# Patient Record
Sex: Male | Born: 1965 | Race: White | Hispanic: No | Marital: Married | Smoking: Never smoker
Health system: Southern US, Community
[De-identification: ages and names within clinical notes are randomized; demographics above are authoritative.]

---

## 2005-09-29 ENCOUNTER — Ambulatory Visit: Payer: Self-pay | Admitting: Internal Medicine

## 2007-06-17 ENCOUNTER — Ambulatory Visit: Payer: Self-pay | Admitting: Internal Medicine

## 2007-06-27 ENCOUNTER — Ambulatory Visit: Payer: Self-pay | Admitting: Internal Medicine

## 2007-08-01 ENCOUNTER — Ambulatory Visit: Payer: Self-pay | Admitting: Internal Medicine

## 2007-09-30 ENCOUNTER — Ambulatory Visit: Payer: Self-pay | Admitting: Internal Medicine

## 2010-04-05 ENCOUNTER — Emergency Department: Payer: Self-pay | Admitting: Emergency Medicine

## 2011-01-02 ENCOUNTER — Emergency Department: Payer: Self-pay | Admitting: Emergency Medicine

## 2011-01-08 ENCOUNTER — Ambulatory Visit: Payer: Self-pay | Admitting: Podiatry

## 2011-09-23 ENCOUNTER — Ambulatory Visit: Payer: Self-pay

## 2012-11-15 ENCOUNTER — Ambulatory Visit: Payer: Self-pay | Admitting: Ophthalmology

## 2013-05-10 ENCOUNTER — Ambulatory Visit: Payer: Self-pay | Admitting: Internal Medicine

## 2013-10-27 IMAGING — US ABDOMEN ULTRASOUND LIMITED
1 series · 17 of 25 positions shown · non-contrast
Comparison: none

REASON FOR EXAM: abd pain generalized
COMMENTS:

PROCEDURE:     [facility] - JEAN CHARLES ABDOMEN UPPER GENERAL  - September 23, 2011  [DATE]
RESULT:     Comparison: None
TECHNIQUE: Multiple gray-scale and color-flow Doppler images of the abdomen
are presented for review.

[Series 1: abdomen ultrasound limited · 17 of 97 slices shown]
[im 1/97]
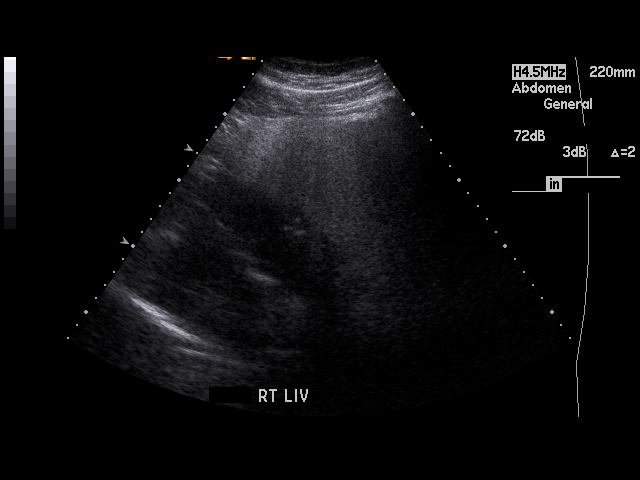
[im 9/97]
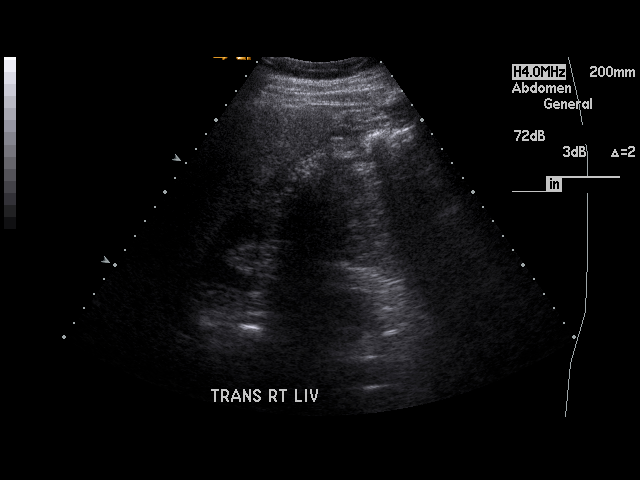
[im 13/97]
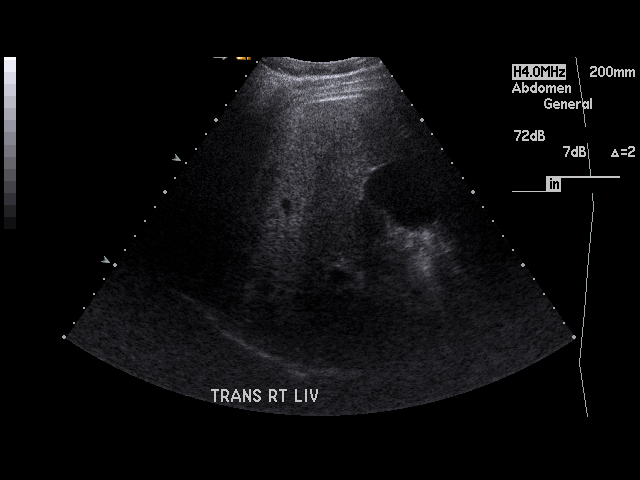
[im 21/97]
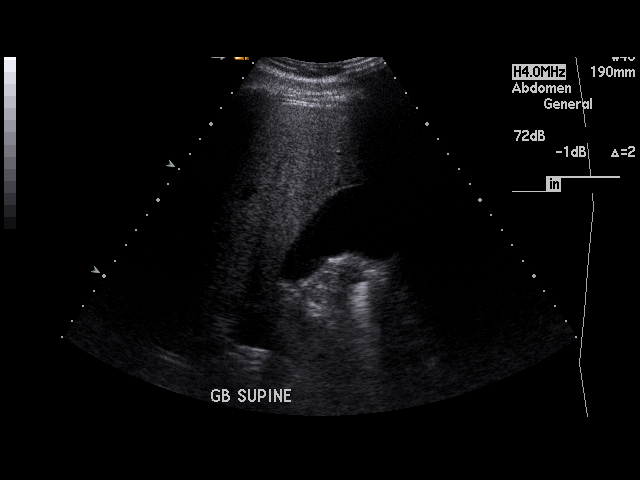
[im 25/97]
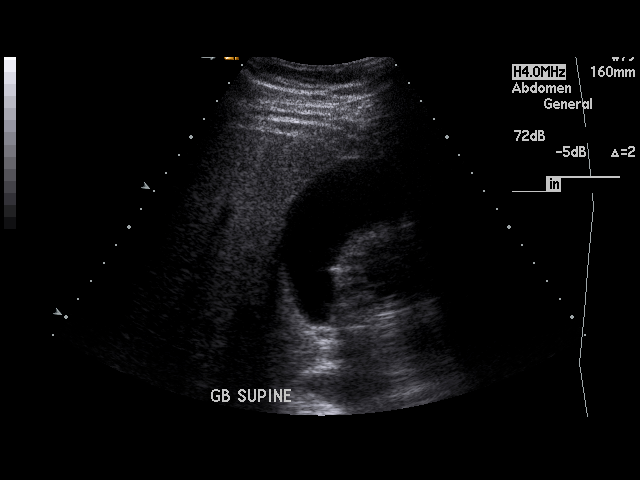
[im 33/97]
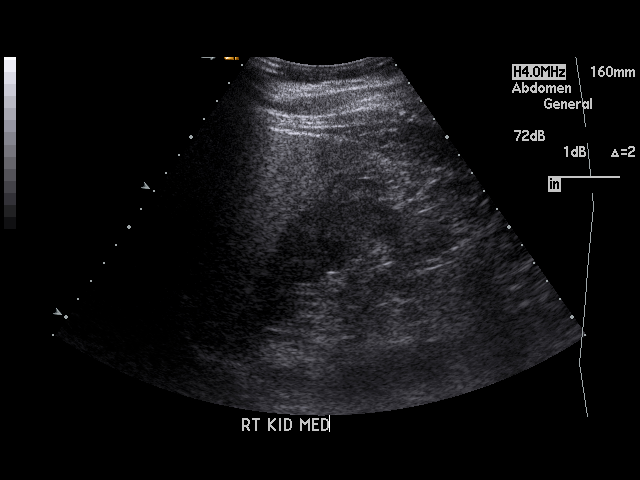
[im 37/97]
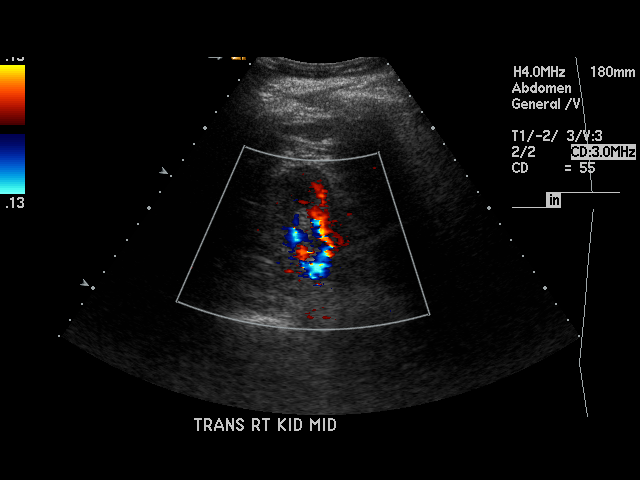
[im 45/97]
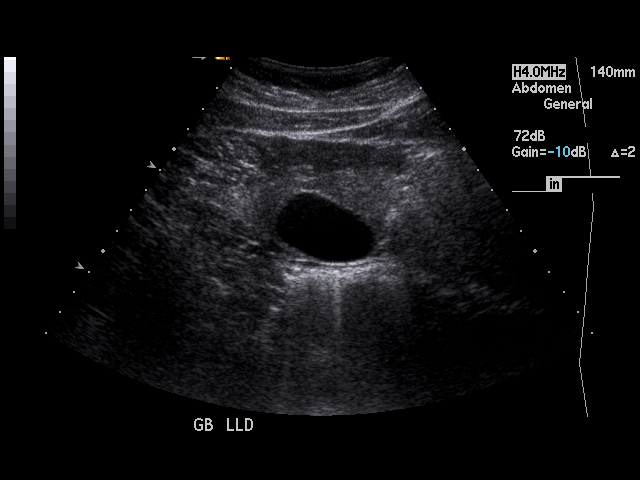
[im 49/97]
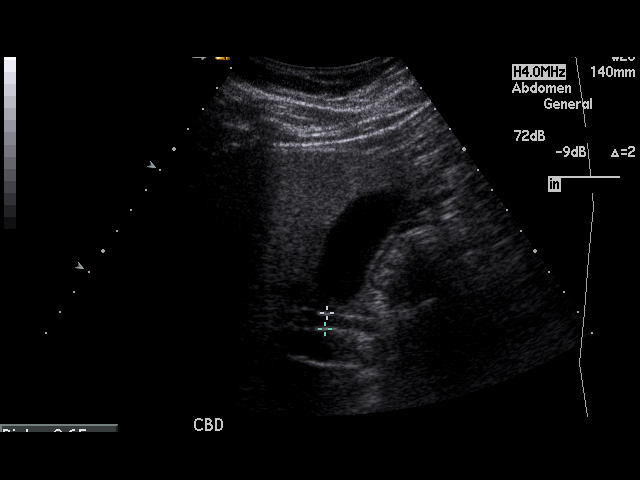
[im 53/97]
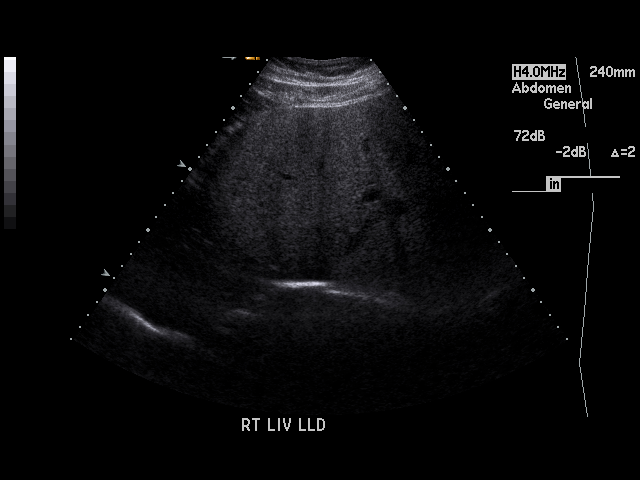
[im 61/97]
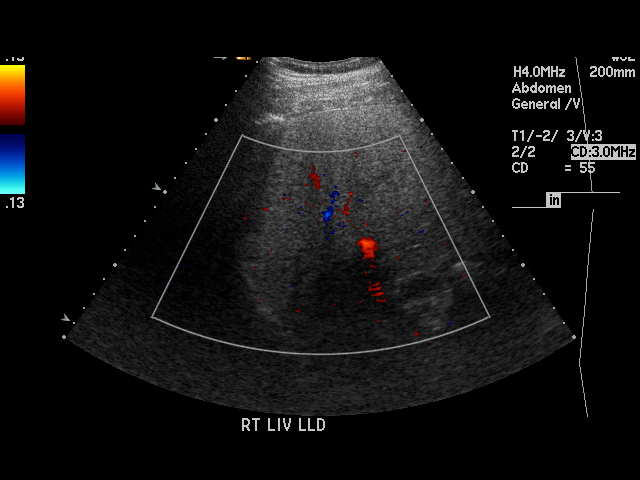
[im 65/97]
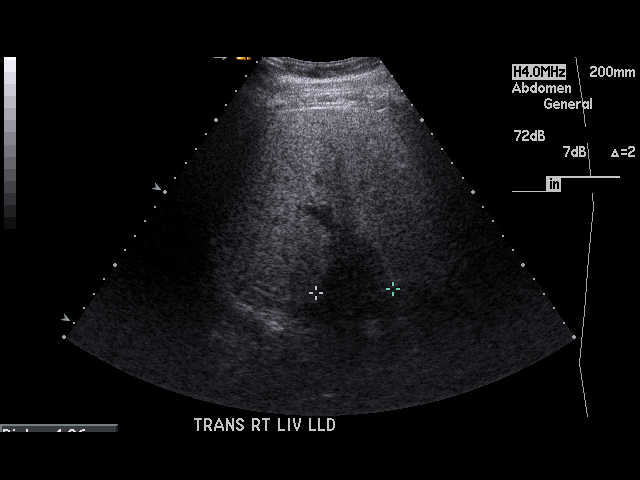
[im 73/97]
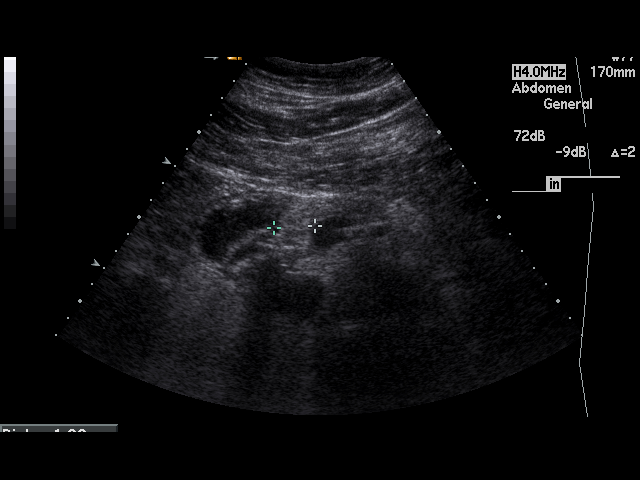
[im 77/97]
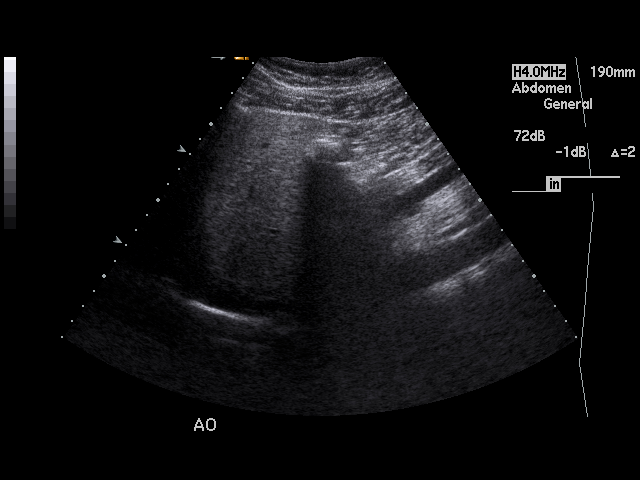
[im 85/97]
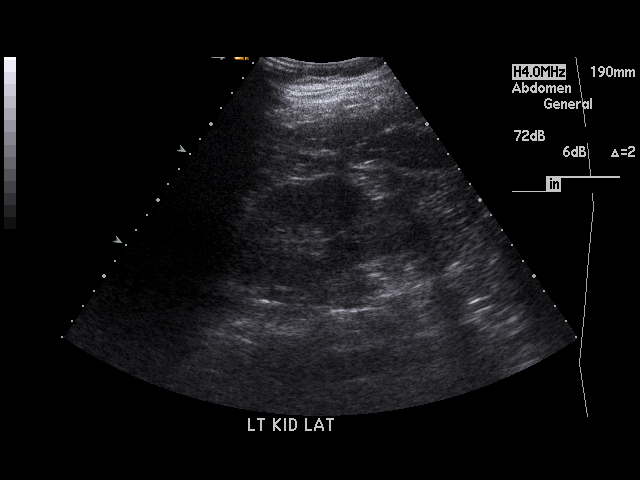
[im 89/97]
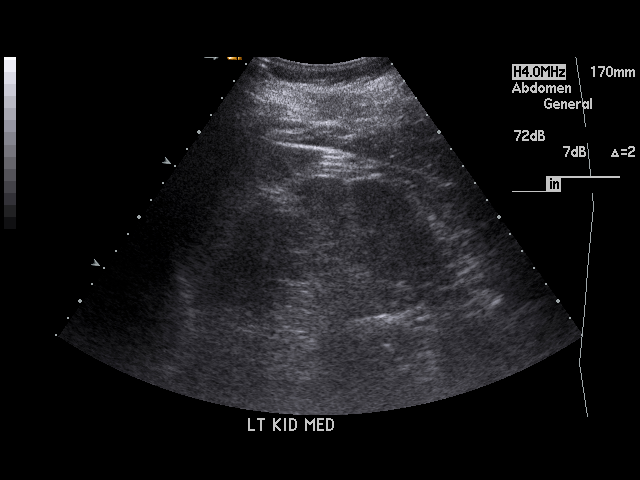
[im 97/97]
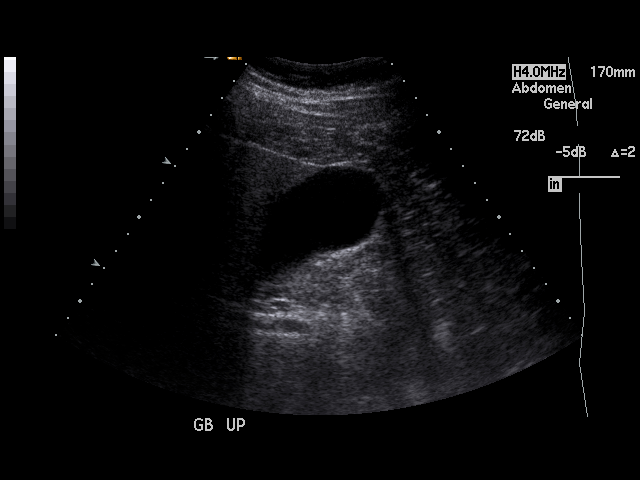

[17 of 25 positions shown; findings below may reference images not displayed]

FINDINGS: The liver is diffusely increased in echogenicity as can be seen with hepatic
steatosis. There is a 6.5 x 4.3 cm area of hypoechogenicity in the posterior
right hepatic lobe near the dome of the liver which may be artifactual
versus an underlying mass.

There is no cholelithiasis or biliary sludge. There is no intra- or
extrahepatic biliary ductal dilatation. The common duct measures 6.5 mm in
maximal diameter. There is no gallbladder wall thickening, pericholecystic
fluid, or sonographic Murphy's sign.

The visualized portion of the pancreas is normal in echogenicity. The spleen
is unremarkable. Bilateral kidneys are normal in echogenicity and size. The
right kidney measures 11.4 x 5.8 x 5 cm. The left kidney measures 11.7 x
x 5.4 cm. There are no renal calculi or hydronephrosis. The abdominal aorta
and IVC are unremarkable.
IMPRESSION: No cholelithiasis or sonographic evidence of acute cholecystitis.

There is a 6.5 x 4.3 cm area of hypoechogenicity in the posterior right
hepatic lobe near the dome of the liver which may be artifactual versus an
underlying mass. Recommend a multiphasic MRI of the abdomen to help further
characterize this area.

## 2014-12-20 IMAGING — CT CT ORBITS WITHOUT CONTRAST
1 series · 15 of 30 positions shown, 19 images · non-contrast
Comparison: none

REASON FOR EXAM: bone mass in left lateral canthus
COMMENTS:

[Series 2: axial bone · axial · 0.34mm/px · z∈[-187,-85]mm · 15 of 55 slices shown, 19 images]
[im 2/55  brain]
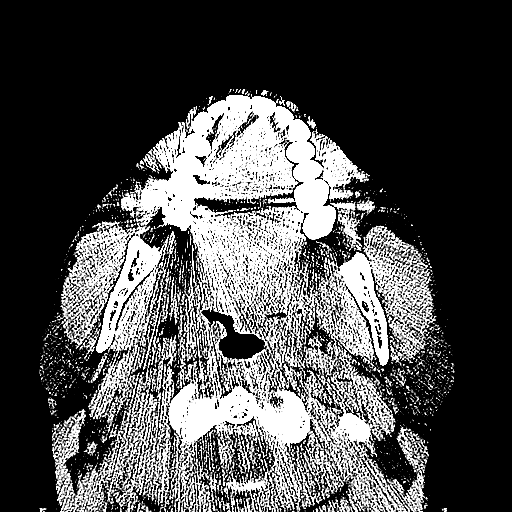
[im 2/55  bone]
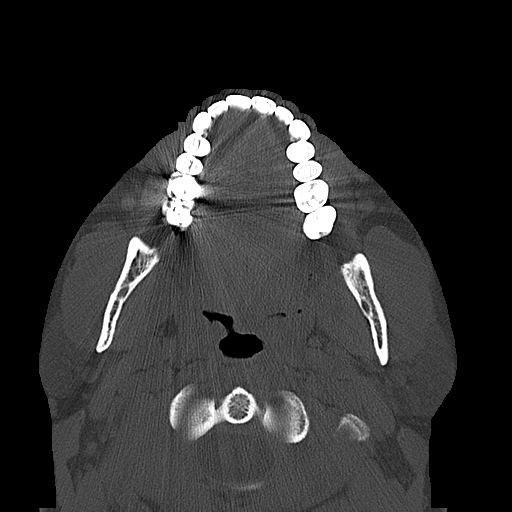
[im 6/55  bone]
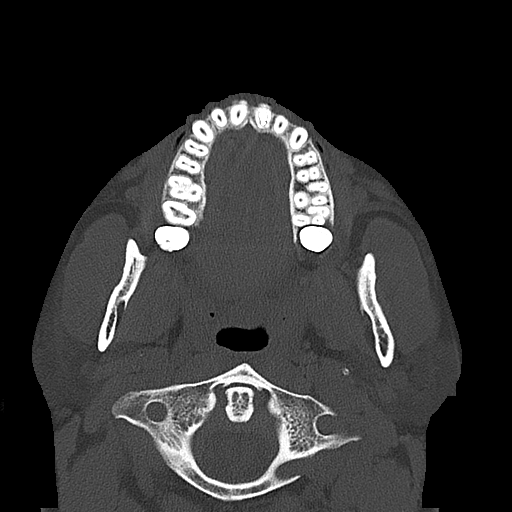
[im 10/55  bone]
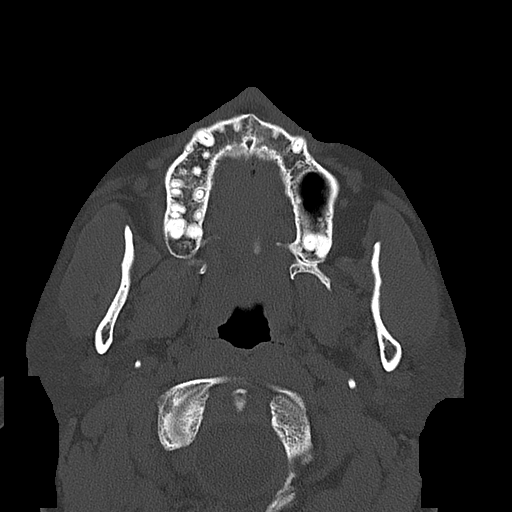
[im 14/55  bone]
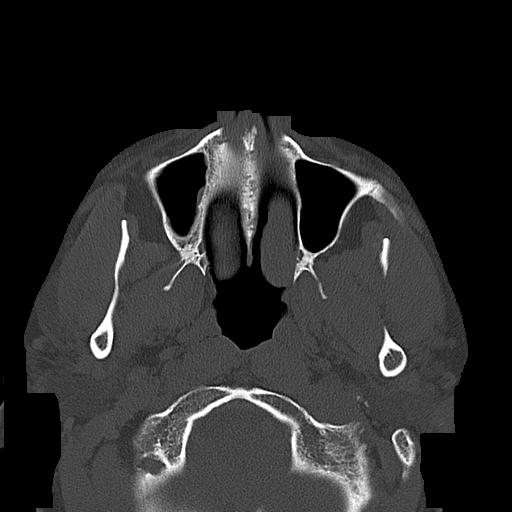
[im 17/55  brain]
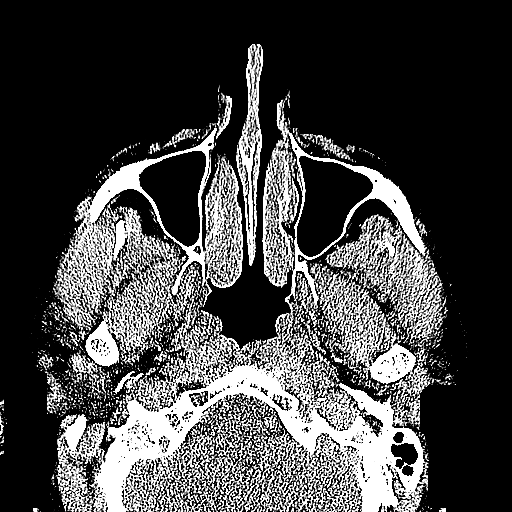
[im 17/55  bone]
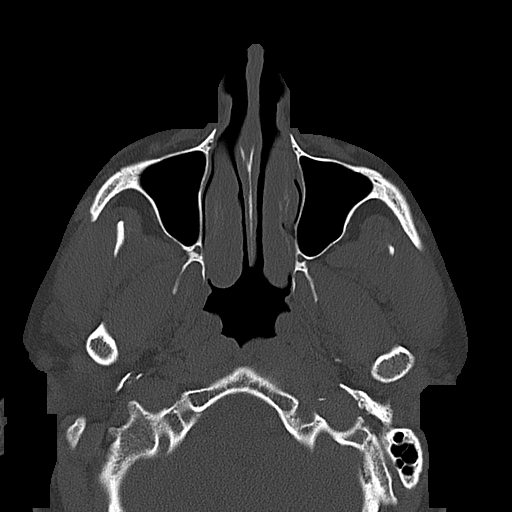
[im 21/55  bone]
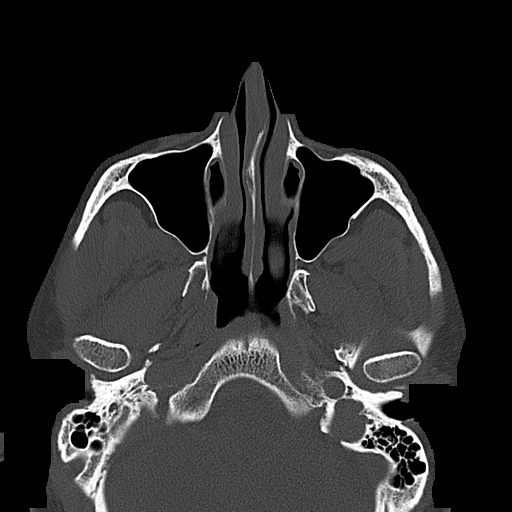
[im 25/55  bone]
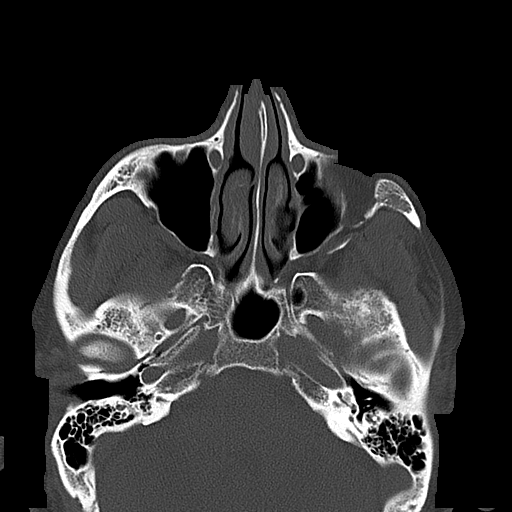
[im 28/55  bone]
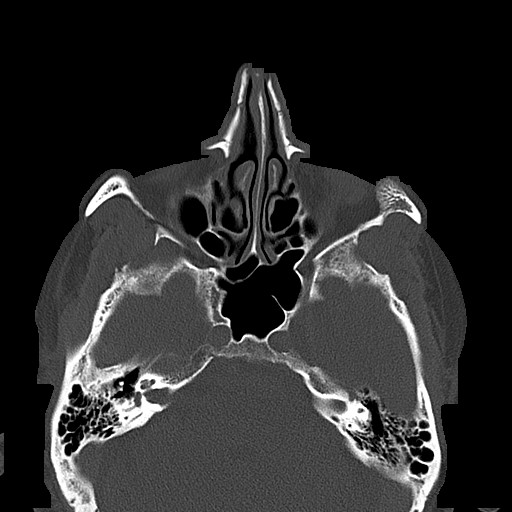
[im 30/55  brain]
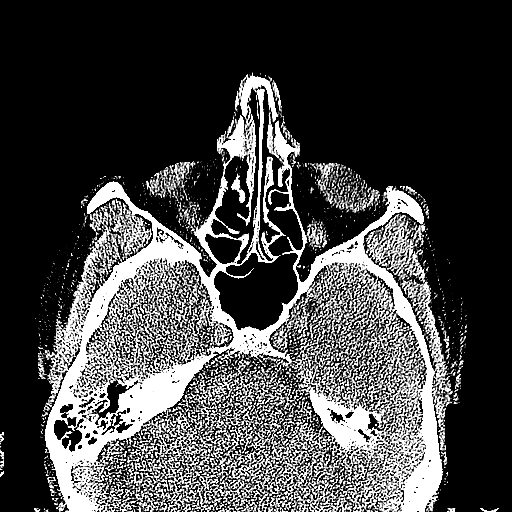
[im 30/55  bone]
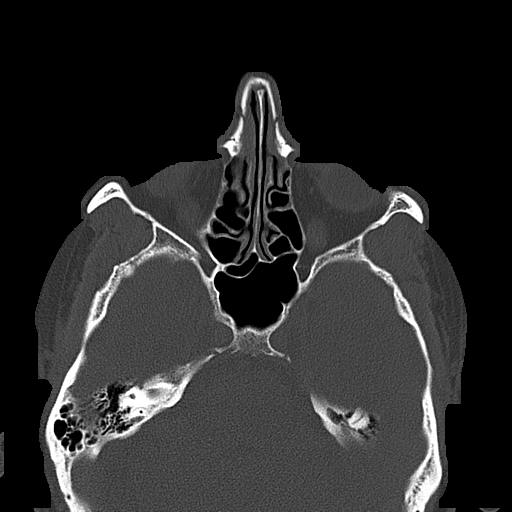
[im 34/55  bone]
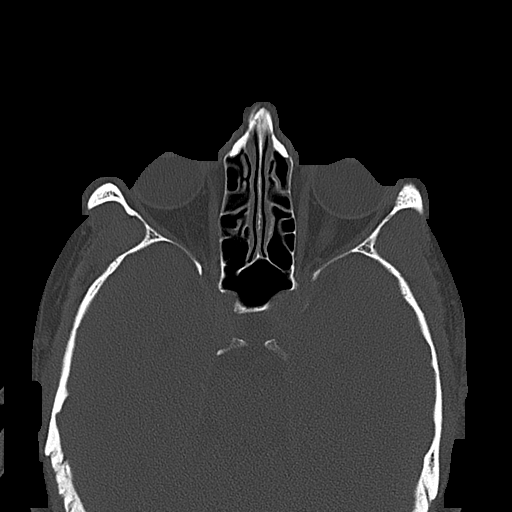
[im 38/55  bone]
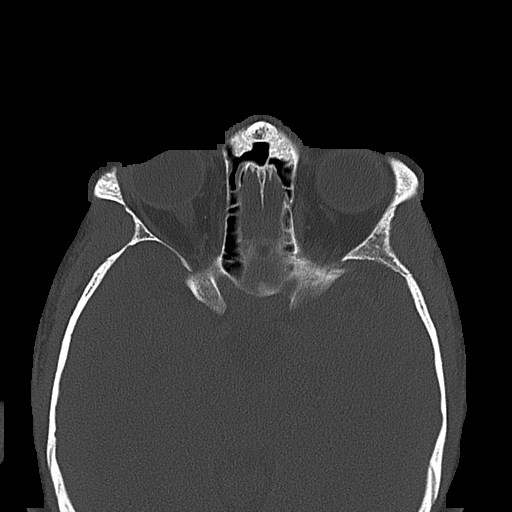
[im 41/55  bone]
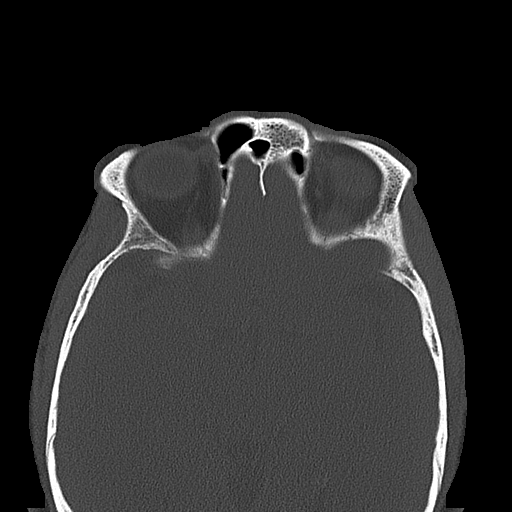
[im 45/55  brain]
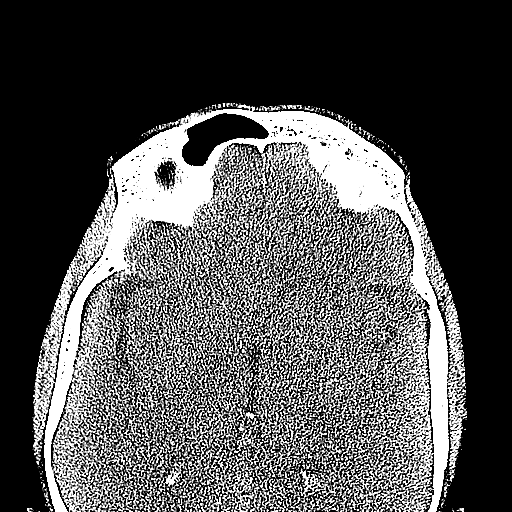
[im 45/55  bone]
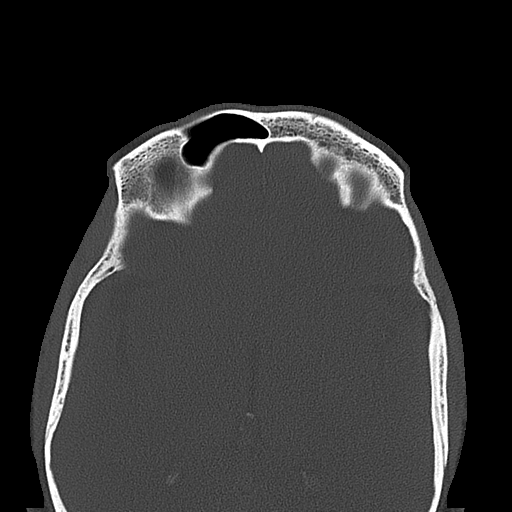
[im 49/55  bone]
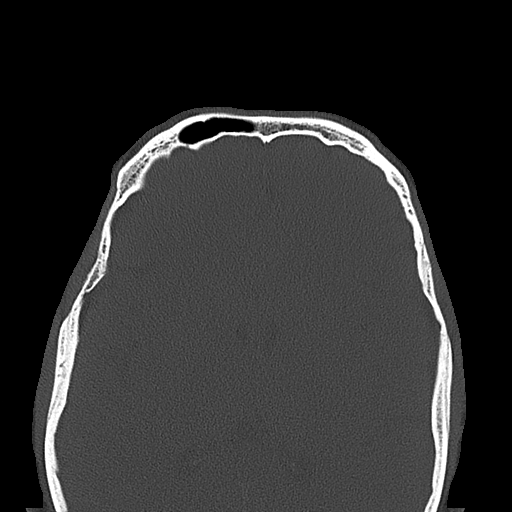
[im 53/55  bone]
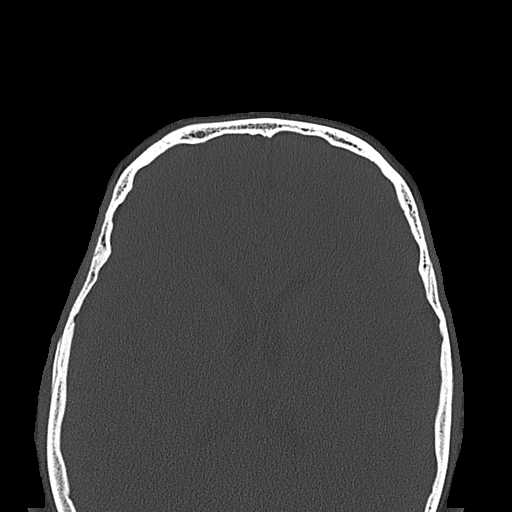

[15 of 30 positions shown; findings below may reference images not displayed]

PROCEDURE:     [facility] - KCT ORBITS OR TEMPORAL BONE WO  - November 15, 2012  [DATE]

RESULT:     Axial CT scanning was performed through the facial bones with
attention to the orbits. Coronal reconstructions were obtained. Images were
acquired at 3 mm intervals and slice thicknesses in the axial plane.

There is increased prominence of the lateral aspect of the bony orbit on the
left as compared to the right. This corresponds to the clinical findings.
This is at the junction of the anterior/inferior aspect of the zygomatic
bone with the anterior-inferior aspect of the lateral orbital wall. The
globes are grossly intact. The pre- and postseptal soft tissues appear
normal. The extraocular eye muscles appear normal. The paranasal sinuses are
clear. The left frontal sinuses are hypoplastic. The nasal septum is midline.
IMPRESSION: 1. There is expansile remodeling of the extreme lateral aspect of the bony
orbit at the junction of the inferior aspect of the zygomatic arch with the
lateral bony orbit. There is no periosteal reaction here. There appears to
be some expansile remodeling of the bone. Further evaluation with MRI with
in an effort to evaluate the bone marrow is needed.
2. No abnormality of the bony orbits is seen elsewhere. The soft tissues
also appear normal.

[REDACTED]

## 2016-04-27 ENCOUNTER — Other Ambulatory Visit: Payer: Self-pay | Admitting: Internal Medicine

## 2016-04-27 DIAGNOSIS — M545 Low back pain: Secondary | ICD-10-CM

## 2016-05-12 ENCOUNTER — Ambulatory Visit
Admission: RE | Admit: 2016-05-12 | Discharge: 2016-05-12 | Disposition: A | Discharge status: Home / Self Care | Payer: 59 | Source: Ambulatory Visit | Attending: Internal Medicine | Admitting: Internal Medicine

## 2016-05-12 DIAGNOSIS — M5127 Other intervertebral disc displacement, lumbosacral region: Secondary | ICD-10-CM | POA: Insufficient documentation

## 2016-05-12 DIAGNOSIS — M5126 Other intervertebral disc displacement, lumbar region: Secondary | ICD-10-CM | POA: Diagnosis not present

## 2016-05-12 DIAGNOSIS — M545 Low back pain: Secondary | ICD-10-CM

## 2016-07-13 DIAGNOSIS — M542 Cervicalgia: Secondary | ICD-10-CM | POA: Diagnosis not present

## 2016-07-13 DIAGNOSIS — R0789 Other chest pain: Secondary | ICD-10-CM | POA: Diagnosis not present

## 2016-07-13 DIAGNOSIS — E784 Other hyperlipidemia: Secondary | ICD-10-CM | POA: Diagnosis not present

## 2016-07-15 DIAGNOSIS — M9903 Segmental and somatic dysfunction of lumbar region: Secondary | ICD-10-CM | POA: Diagnosis not present

## 2016-07-15 DIAGNOSIS — M5116 Intervertebral disc disorders with radiculopathy, lumbar region: Secondary | ICD-10-CM | POA: Diagnosis not present

## 2016-07-15 DIAGNOSIS — M5117 Intervertebral disc disorders with radiculopathy, lumbosacral region: Secondary | ICD-10-CM | POA: Diagnosis not present

## 2016-07-16 DIAGNOSIS — Z85828 Personal history of other malignant neoplasm of skin: Secondary | ICD-10-CM | POA: Diagnosis not present

## 2016-07-17 DIAGNOSIS — M542 Cervicalgia: Secondary | ICD-10-CM | POA: Diagnosis not present

## 2016-07-17 DIAGNOSIS — G8929 Other chronic pain: Secondary | ICD-10-CM | POA: Diagnosis not present

## 2016-07-27 DIAGNOSIS — R0789 Other chest pain: Secondary | ICD-10-CM | POA: Diagnosis not present

## 2016-07-27 DIAGNOSIS — M5136 Other intervertebral disc degeneration, lumbar region: Secondary | ICD-10-CM | POA: Diagnosis not present

## 2016-08-06 DIAGNOSIS — M5116 Intervertebral disc disorders with radiculopathy, lumbar region: Secondary | ICD-10-CM | POA: Diagnosis not present

## 2016-08-06 DIAGNOSIS — M9903 Segmental and somatic dysfunction of lumbar region: Secondary | ICD-10-CM | POA: Diagnosis not present

## 2016-08-06 DIAGNOSIS — M5117 Intervertebral disc disorders with radiculopathy, lumbosacral region: Secondary | ICD-10-CM | POA: Diagnosis not present

## 2016-09-10 DIAGNOSIS — M5116 Intervertebral disc disorders with radiculopathy, lumbar region: Secondary | ICD-10-CM | POA: Diagnosis not present

## 2016-09-10 DIAGNOSIS — M5117 Intervertebral disc disorders with radiculopathy, lumbosacral region: Secondary | ICD-10-CM | POA: Diagnosis not present

## 2016-09-10 DIAGNOSIS — M9903 Segmental and somatic dysfunction of lumbar region: Secondary | ICD-10-CM | POA: Diagnosis not present

## 2016-10-15 DIAGNOSIS — M9903 Segmental and somatic dysfunction of lumbar region: Secondary | ICD-10-CM | POA: Diagnosis not present

## 2016-10-15 DIAGNOSIS — M5117 Intervertebral disc disorders with radiculopathy, lumbosacral region: Secondary | ICD-10-CM | POA: Diagnosis not present

## 2016-10-15 DIAGNOSIS — M5116 Intervertebral disc disorders with radiculopathy, lumbar region: Secondary | ICD-10-CM | POA: Diagnosis not present

## 2016-11-04 DIAGNOSIS — M9903 Segmental and somatic dysfunction of lumbar region: Secondary | ICD-10-CM | POA: Diagnosis not present

## 2016-11-04 DIAGNOSIS — M5117 Intervertebral disc disorders with radiculopathy, lumbosacral region: Secondary | ICD-10-CM | POA: Diagnosis not present

## 2016-11-04 DIAGNOSIS — M5116 Intervertebral disc disorders with radiculopathy, lumbar region: Secondary | ICD-10-CM | POA: Diagnosis not present

## 2016-11-10 DIAGNOSIS — E538 Deficiency of other specified B group vitamins: Secondary | ICD-10-CM | POA: Diagnosis not present

## 2016-11-10 DIAGNOSIS — R739 Hyperglycemia, unspecified: Secondary | ICD-10-CM | POA: Diagnosis not present

## 2016-11-10 DIAGNOSIS — E349 Endocrine disorder, unspecified: Secondary | ICD-10-CM | POA: Diagnosis not present

## 2016-11-17 DIAGNOSIS — Z0000 Encounter for general adult medical examination without abnormal findings: Secondary | ICD-10-CM | POA: Diagnosis not present

## 2016-11-17 DIAGNOSIS — E784 Other hyperlipidemia: Secondary | ICD-10-CM | POA: Diagnosis not present

## 2016-11-17 DIAGNOSIS — R739 Hyperglycemia, unspecified: Secondary | ICD-10-CM | POA: Diagnosis not present

## 2016-11-26 DIAGNOSIS — M9903 Segmental and somatic dysfunction of lumbar region: Secondary | ICD-10-CM | POA: Diagnosis not present

## 2016-11-26 DIAGNOSIS — M5116 Intervertebral disc disorders with radiculopathy, lumbar region: Secondary | ICD-10-CM | POA: Diagnosis not present

## 2016-11-26 DIAGNOSIS — M5117 Intervertebral disc disorders with radiculopathy, lumbosacral region: Secondary | ICD-10-CM | POA: Diagnosis not present

## 2017-01-15 DIAGNOSIS — M5116 Intervertebral disc disorders with radiculopathy, lumbar region: Secondary | ICD-10-CM | POA: Diagnosis not present

## 2017-01-15 DIAGNOSIS — M5117 Intervertebral disc disorders with radiculopathy, lumbosacral region: Secondary | ICD-10-CM | POA: Diagnosis not present

## 2017-01-15 DIAGNOSIS — M9903 Segmental and somatic dysfunction of lumbar region: Secondary | ICD-10-CM | POA: Diagnosis not present

## 2017-01-21 DIAGNOSIS — M9903 Segmental and somatic dysfunction of lumbar region: Secondary | ICD-10-CM | POA: Diagnosis not present

## 2017-01-21 DIAGNOSIS — M5117 Intervertebral disc disorders with radiculopathy, lumbosacral region: Secondary | ICD-10-CM | POA: Diagnosis not present

## 2017-01-21 DIAGNOSIS — M5116 Intervertebral disc disorders with radiculopathy, lumbar region: Secondary | ICD-10-CM | POA: Diagnosis not present

## 2017-02-04 DIAGNOSIS — M5116 Intervertebral disc disorders with radiculopathy, lumbar region: Secondary | ICD-10-CM | POA: Diagnosis not present

## 2017-02-04 DIAGNOSIS — M9903 Segmental and somatic dysfunction of lumbar region: Secondary | ICD-10-CM | POA: Diagnosis not present

## 2017-02-04 DIAGNOSIS — M5117 Intervertebral disc disorders with radiculopathy, lumbosacral region: Secondary | ICD-10-CM | POA: Diagnosis not present

## 2017-03-01 DIAGNOSIS — M5117 Intervertebral disc disorders with radiculopathy, lumbosacral region: Secondary | ICD-10-CM | POA: Diagnosis not present

## 2017-03-01 DIAGNOSIS — M5116 Intervertebral disc disorders with radiculopathy, lumbar region: Secondary | ICD-10-CM | POA: Diagnosis not present

## 2017-03-01 DIAGNOSIS — M9903 Segmental and somatic dysfunction of lumbar region: Secondary | ICD-10-CM | POA: Diagnosis not present

## 2017-03-17 DIAGNOSIS — M79602 Pain in left arm: Secondary | ICD-10-CM | POA: Diagnosis not present

## 2017-03-30 DIAGNOSIS — M9903 Segmental and somatic dysfunction of lumbar region: Secondary | ICD-10-CM | POA: Diagnosis not present

## 2017-03-30 DIAGNOSIS — M5116 Intervertebral disc disorders with radiculopathy, lumbar region: Secondary | ICD-10-CM | POA: Diagnosis not present

## 2017-03-30 DIAGNOSIS — M5117 Intervertebral disc disorders with radiculopathy, lumbosacral region: Secondary | ICD-10-CM | POA: Diagnosis not present

## 2017-04-01 DIAGNOSIS — Z1211 Encounter for screening for malignant neoplasm of colon: Secondary | ICD-10-CM | POA: Diagnosis not present

## 2017-04-01 DIAGNOSIS — Z8371 Family history of colonic polyps: Secondary | ICD-10-CM | POA: Diagnosis not present

## 2017-04-05 DIAGNOSIS — M5116 Intervertebral disc disorders with radiculopathy, lumbar region: Secondary | ICD-10-CM | POA: Diagnosis not present

## 2017-04-05 DIAGNOSIS — M5117 Intervertebral disc disorders with radiculopathy, lumbosacral region: Secondary | ICD-10-CM | POA: Diagnosis not present

## 2017-04-05 DIAGNOSIS — M9903 Segmental and somatic dysfunction of lumbar region: Secondary | ICD-10-CM | POA: Diagnosis not present

## 2017-04-19 DIAGNOSIS — D124 Benign neoplasm of descending colon: Secondary | ICD-10-CM | POA: Diagnosis not present

## 2017-04-19 DIAGNOSIS — K648 Other hemorrhoids: Secondary | ICD-10-CM | POA: Diagnosis not present

## 2017-04-19 DIAGNOSIS — Z1211 Encounter for screening for malignant neoplasm of colon: Secondary | ICD-10-CM | POA: Diagnosis not present

## 2017-04-19 DIAGNOSIS — K644 Residual hemorrhoidal skin tags: Secondary | ICD-10-CM | POA: Diagnosis not present

## 2017-04-19 DIAGNOSIS — Z8371 Family history of colonic polyps: Secondary | ICD-10-CM | POA: Diagnosis not present

## 2017-04-19 DIAGNOSIS — D123 Benign neoplasm of transverse colon: Secondary | ICD-10-CM | POA: Diagnosis not present

## 2017-04-20 DIAGNOSIS — K648 Other hemorrhoids: Secondary | ICD-10-CM | POA: Diagnosis not present

## 2017-04-20 DIAGNOSIS — K644 Residual hemorrhoidal skin tags: Secondary | ICD-10-CM | POA: Diagnosis not present

## 2017-04-20 DIAGNOSIS — Z8371 Family history of colonic polyps: Secondary | ICD-10-CM | POA: Diagnosis not present

## 2017-05-17 DIAGNOSIS — M9903 Segmental and somatic dysfunction of lumbar region: Secondary | ICD-10-CM | POA: Diagnosis not present

## 2017-05-17 DIAGNOSIS — M5117 Intervertebral disc disorders with radiculopathy, lumbosacral region: Secondary | ICD-10-CM | POA: Diagnosis not present

## 2017-05-17 DIAGNOSIS — M5116 Intervertebral disc disorders with radiculopathy, lumbar region: Secondary | ICD-10-CM | POA: Diagnosis not present

## 2017-05-18 DIAGNOSIS — R739 Hyperglycemia, unspecified: Secondary | ICD-10-CM | POA: Diagnosis not present

## 2017-05-18 DIAGNOSIS — E7849 Other hyperlipidemia: Secondary | ICD-10-CM | POA: Diagnosis not present

## 2017-05-18 DIAGNOSIS — E349 Endocrine disorder, unspecified: Secondary | ICD-10-CM | POA: Diagnosis not present

## 2017-05-25 DIAGNOSIS — Z23 Encounter for immunization: Secondary | ICD-10-CM | POA: Diagnosis not present

## 2017-05-25 DIAGNOSIS — E7849 Other hyperlipidemia: Secondary | ICD-10-CM | POA: Diagnosis not present

## 2017-05-25 DIAGNOSIS — R739 Hyperglycemia, unspecified: Secondary | ICD-10-CM | POA: Diagnosis not present

## 2017-05-25 DIAGNOSIS — E538 Deficiency of other specified B group vitamins: Secondary | ICD-10-CM | POA: Diagnosis not present

## 2017-06-16 DIAGNOSIS — M5116 Intervertebral disc disorders with radiculopathy, lumbar region: Secondary | ICD-10-CM | POA: Diagnosis not present

## 2017-06-16 DIAGNOSIS — M5117 Intervertebral disc disorders with radiculopathy, lumbosacral region: Secondary | ICD-10-CM | POA: Diagnosis not present

## 2017-06-16 DIAGNOSIS — M9903 Segmental and somatic dysfunction of lumbar region: Secondary | ICD-10-CM | POA: Diagnosis not present

## 2017-07-02 DIAGNOSIS — R079 Chest pain, unspecified: Secondary | ICD-10-CM | POA: Diagnosis not present

## 2017-07-02 DIAGNOSIS — I10 Essential (primary) hypertension: Secondary | ICD-10-CM | POA: Diagnosis not present

## 2017-07-15 DIAGNOSIS — Z85828 Personal history of other malignant neoplasm of skin: Secondary | ICD-10-CM | POA: Diagnosis not present

## 2017-07-15 DIAGNOSIS — L570 Actinic keratosis: Secondary | ICD-10-CM | POA: Diagnosis not present

## 2017-07-20 DIAGNOSIS — R079 Chest pain, unspecified: Secondary | ICD-10-CM | POA: Diagnosis not present

## 2017-09-02 DIAGNOSIS — M5116 Intervertebral disc disorders with radiculopathy, lumbar region: Secondary | ICD-10-CM | POA: Diagnosis not present

## 2017-09-02 DIAGNOSIS — M5117 Intervertebral disc disorders with radiculopathy, lumbosacral region: Secondary | ICD-10-CM | POA: Diagnosis not present

## 2017-09-02 DIAGNOSIS — M9903 Segmental and somatic dysfunction of lumbar region: Secondary | ICD-10-CM | POA: Diagnosis not present

## 2017-10-20 DIAGNOSIS — M5116 Intervertebral disc disorders with radiculopathy, lumbar region: Secondary | ICD-10-CM | POA: Diagnosis not present

## 2017-10-20 DIAGNOSIS — M5117 Intervertebral disc disorders with radiculopathy, lumbosacral region: Secondary | ICD-10-CM | POA: Diagnosis not present

## 2017-10-20 DIAGNOSIS — M9903 Segmental and somatic dysfunction of lumbar region: Secondary | ICD-10-CM | POA: Diagnosis not present

## 2017-11-17 DIAGNOSIS — E538 Deficiency of other specified B group vitamins: Secondary | ICD-10-CM | POA: Diagnosis not present

## 2017-11-17 DIAGNOSIS — E349 Endocrine disorder, unspecified: Secondary | ICD-10-CM | POA: Diagnosis not present

## 2017-11-22 DIAGNOSIS — M5116 Intervertebral disc disorders with radiculopathy, lumbar region: Secondary | ICD-10-CM | POA: Diagnosis not present

## 2017-11-22 DIAGNOSIS — M9903 Segmental and somatic dysfunction of lumbar region: Secondary | ICD-10-CM | POA: Diagnosis not present

## 2017-11-22 DIAGNOSIS — M5117 Intervertebral disc disorders with radiculopathy, lumbosacral region: Secondary | ICD-10-CM | POA: Diagnosis not present

## 2017-11-24 DIAGNOSIS — R3129 Other microscopic hematuria: Secondary | ICD-10-CM | POA: Diagnosis not present

## 2017-11-24 DIAGNOSIS — E7849 Other hyperlipidemia: Secondary | ICD-10-CM | POA: Diagnosis not present

## 2017-11-24 DIAGNOSIS — I10 Essential (primary) hypertension: Secondary | ICD-10-CM | POA: Diagnosis not present

## 2017-12-09 DIAGNOSIS — M9903 Segmental and somatic dysfunction of lumbar region: Secondary | ICD-10-CM | POA: Diagnosis not present

## 2017-12-09 DIAGNOSIS — M5117 Intervertebral disc disorders with radiculopathy, lumbosacral region: Secondary | ICD-10-CM | POA: Diagnosis not present

## 2017-12-09 DIAGNOSIS — M5116 Intervertebral disc disorders with radiculopathy, lumbar region: Secondary | ICD-10-CM | POA: Diagnosis not present

## 2017-12-22 DIAGNOSIS — M5117 Intervertebral disc disorders with radiculopathy, lumbosacral region: Secondary | ICD-10-CM | POA: Diagnosis not present

## 2017-12-22 DIAGNOSIS — M5116 Intervertebral disc disorders with radiculopathy, lumbar region: Secondary | ICD-10-CM | POA: Diagnosis not present

## 2017-12-22 DIAGNOSIS — M9903 Segmental and somatic dysfunction of lumbar region: Secondary | ICD-10-CM | POA: Diagnosis not present

## 2017-12-28 DIAGNOSIS — M5117 Intervertebral disc disorders with radiculopathy, lumbosacral region: Secondary | ICD-10-CM | POA: Diagnosis not present

## 2017-12-28 DIAGNOSIS — M5116 Intervertebral disc disorders with radiculopathy, lumbar region: Secondary | ICD-10-CM | POA: Diagnosis not present

## 2017-12-28 DIAGNOSIS — M9903 Segmental and somatic dysfunction of lumbar region: Secondary | ICD-10-CM | POA: Diagnosis not present

## 2018-01-19 DIAGNOSIS — M9903 Segmental and somatic dysfunction of lumbar region: Secondary | ICD-10-CM | POA: Diagnosis not present

## 2018-01-19 DIAGNOSIS — M5116 Intervertebral disc disorders with radiculopathy, lumbar region: Secondary | ICD-10-CM | POA: Diagnosis not present

## 2018-01-19 DIAGNOSIS — M5117 Intervertebral disc disorders with radiculopathy, lumbosacral region: Secondary | ICD-10-CM | POA: Diagnosis not present

## 2018-02-23 DIAGNOSIS — M9903 Segmental and somatic dysfunction of lumbar region: Secondary | ICD-10-CM | POA: Diagnosis not present

## 2018-02-23 DIAGNOSIS — M5116 Intervertebral disc disorders with radiculopathy, lumbar region: Secondary | ICD-10-CM | POA: Diagnosis not present

## 2018-02-23 DIAGNOSIS — M5117 Intervertebral disc disorders with radiculopathy, lumbosacral region: Secondary | ICD-10-CM | POA: Diagnosis not present

## 2018-03-03 DIAGNOSIS — M9903 Segmental and somatic dysfunction of lumbar region: Secondary | ICD-10-CM | POA: Diagnosis not present

## 2018-03-03 DIAGNOSIS — M5116 Intervertebral disc disorders with radiculopathy, lumbar region: Secondary | ICD-10-CM | POA: Diagnosis not present

## 2018-03-03 DIAGNOSIS — M5117 Intervertebral disc disorders with radiculopathy, lumbosacral region: Secondary | ICD-10-CM | POA: Diagnosis not present

## 2018-03-14 DIAGNOSIS — M5117 Intervertebral disc disorders with radiculopathy, lumbosacral region: Secondary | ICD-10-CM | POA: Diagnosis not present

## 2018-03-14 DIAGNOSIS — M5116 Intervertebral disc disorders with radiculopathy, lumbar region: Secondary | ICD-10-CM | POA: Diagnosis not present

## 2018-03-14 DIAGNOSIS — M9903 Segmental and somatic dysfunction of lumbar region: Secondary | ICD-10-CM | POA: Diagnosis not present

## 2018-03-23 DIAGNOSIS — M9903 Segmental and somatic dysfunction of lumbar region: Secondary | ICD-10-CM | POA: Diagnosis not present

## 2018-03-23 DIAGNOSIS — M5116 Intervertebral disc disorders with radiculopathy, lumbar region: Secondary | ICD-10-CM | POA: Diagnosis not present

## 2018-03-23 DIAGNOSIS — M5117 Intervertebral disc disorders with radiculopathy, lumbosacral region: Secondary | ICD-10-CM | POA: Diagnosis not present

## 2018-04-06 DIAGNOSIS — M9903 Segmental and somatic dysfunction of lumbar region: Secondary | ICD-10-CM | POA: Diagnosis not present

## 2018-04-06 DIAGNOSIS — M5117 Intervertebral disc disorders with radiculopathy, lumbosacral region: Secondary | ICD-10-CM | POA: Diagnosis not present

## 2018-04-06 DIAGNOSIS — M5116 Intervertebral disc disorders with radiculopathy, lumbar region: Secondary | ICD-10-CM | POA: Diagnosis not present

## 2018-04-19 DIAGNOSIS — M9903 Segmental and somatic dysfunction of lumbar region: Secondary | ICD-10-CM | POA: Diagnosis not present

## 2018-04-19 DIAGNOSIS — M5117 Intervertebral disc disorders with radiculopathy, lumbosacral region: Secondary | ICD-10-CM | POA: Diagnosis not present

## 2018-04-19 DIAGNOSIS — M5116 Intervertebral disc disorders with radiculopathy, lumbar region: Secondary | ICD-10-CM | POA: Diagnosis not present

## 2018-04-26 DIAGNOSIS — M9903 Segmental and somatic dysfunction of lumbar region: Secondary | ICD-10-CM | POA: Diagnosis not present

## 2018-04-26 DIAGNOSIS — M5117 Intervertebral disc disorders with radiculopathy, lumbosacral region: Secondary | ICD-10-CM | POA: Diagnosis not present

## 2018-04-26 DIAGNOSIS — M5116 Intervertebral disc disorders with radiculopathy, lumbar region: Secondary | ICD-10-CM | POA: Diagnosis not present

## 2018-05-10 DIAGNOSIS — M5116 Intervertebral disc disorders with radiculopathy, lumbar region: Secondary | ICD-10-CM | POA: Diagnosis not present

## 2018-05-10 DIAGNOSIS — M5117 Intervertebral disc disorders with radiculopathy, lumbosacral region: Secondary | ICD-10-CM | POA: Diagnosis not present

## 2018-05-10 DIAGNOSIS — M9903 Segmental and somatic dysfunction of lumbar region: Secondary | ICD-10-CM | POA: Diagnosis not present

## 2018-05-20 DIAGNOSIS — E349 Endocrine disorder, unspecified: Secondary | ICD-10-CM | POA: Diagnosis not present

## 2018-05-20 DIAGNOSIS — E538 Deficiency of other specified B group vitamins: Secondary | ICD-10-CM | POA: Diagnosis not present

## 2018-05-20 DIAGNOSIS — R739 Hyperglycemia, unspecified: Secondary | ICD-10-CM | POA: Diagnosis not present

## 2018-05-27 DIAGNOSIS — M5116 Intervertebral disc disorders with radiculopathy, lumbar region: Secondary | ICD-10-CM | POA: Diagnosis not present

## 2018-05-27 DIAGNOSIS — Z23 Encounter for immunization: Secondary | ICD-10-CM | POA: Diagnosis not present

## 2018-05-27 DIAGNOSIS — M9903 Segmental and somatic dysfunction of lumbar region: Secondary | ICD-10-CM | POA: Diagnosis not present

## 2018-05-27 DIAGNOSIS — M5117 Intervertebral disc disorders with radiculopathy, lumbosacral region: Secondary | ICD-10-CM | POA: Diagnosis not present

## 2018-05-30 DIAGNOSIS — M5117 Intervertebral disc disorders with radiculopathy, lumbosacral region: Secondary | ICD-10-CM | POA: Diagnosis not present

## 2018-05-30 DIAGNOSIS — M9903 Segmental and somatic dysfunction of lumbar region: Secondary | ICD-10-CM | POA: Diagnosis not present

## 2018-05-30 DIAGNOSIS — M5116 Intervertebral disc disorders with radiculopathy, lumbar region: Secondary | ICD-10-CM | POA: Diagnosis not present

## 2018-05-31 DIAGNOSIS — Z23 Encounter for immunization: Secondary | ICD-10-CM | POA: Diagnosis not present

## 2018-06-13 DIAGNOSIS — M5117 Intervertebral disc disorders with radiculopathy, lumbosacral region: Secondary | ICD-10-CM | POA: Diagnosis not present

## 2018-06-13 DIAGNOSIS — M9903 Segmental and somatic dysfunction of lumbar region: Secondary | ICD-10-CM | POA: Diagnosis not present

## 2018-06-13 DIAGNOSIS — M5116 Intervertebral disc disorders with radiculopathy, lumbar region: Secondary | ICD-10-CM | POA: Diagnosis not present

## 2018-06-23 DIAGNOSIS — M9903 Segmental and somatic dysfunction of lumbar region: Secondary | ICD-10-CM | POA: Diagnosis not present

## 2018-06-23 DIAGNOSIS — M5117 Intervertebral disc disorders with radiculopathy, lumbosacral region: Secondary | ICD-10-CM | POA: Diagnosis not present

## 2018-06-23 DIAGNOSIS — M5116 Intervertebral disc disorders with radiculopathy, lumbar region: Secondary | ICD-10-CM | POA: Diagnosis not present

## 2018-07-04 DIAGNOSIS — M5116 Intervertebral disc disorders with radiculopathy, lumbar region: Secondary | ICD-10-CM | POA: Diagnosis not present

## 2018-07-04 DIAGNOSIS — M9903 Segmental and somatic dysfunction of lumbar region: Secondary | ICD-10-CM | POA: Diagnosis not present

## 2018-07-04 DIAGNOSIS — M5117 Intervertebral disc disorders with radiculopathy, lumbosacral region: Secondary | ICD-10-CM | POA: Diagnosis not present

## 2018-07-14 DIAGNOSIS — D2262 Melanocytic nevi of left upper limb, including shoulder: Secondary | ICD-10-CM | POA: Diagnosis not present

## 2018-07-14 DIAGNOSIS — Z85828 Personal history of other malignant neoplasm of skin: Secondary | ICD-10-CM | POA: Diagnosis not present

## 2018-07-14 DIAGNOSIS — D2261 Melanocytic nevi of right upper limb, including shoulder: Secondary | ICD-10-CM | POA: Diagnosis not present

## 2018-07-14 DIAGNOSIS — D485 Neoplasm of uncertain behavior of skin: Secondary | ICD-10-CM | POA: Diagnosis not present

## 2018-07-14 DIAGNOSIS — L570 Actinic keratosis: Secondary | ICD-10-CM | POA: Diagnosis not present

## 2018-07-18 DIAGNOSIS — M5117 Intervertebral disc disorders with radiculopathy, lumbosacral region: Secondary | ICD-10-CM | POA: Diagnosis not present

## 2018-07-18 DIAGNOSIS — M9903 Segmental and somatic dysfunction of lumbar region: Secondary | ICD-10-CM | POA: Diagnosis not present

## 2018-07-18 DIAGNOSIS — M5116 Intervertebral disc disorders with radiculopathy, lumbar region: Secondary | ICD-10-CM | POA: Diagnosis not present

## 2018-08-01 DIAGNOSIS — M5116 Intervertebral disc disorders with radiculopathy, lumbar region: Secondary | ICD-10-CM | POA: Diagnosis not present

## 2018-08-01 DIAGNOSIS — M9903 Segmental and somatic dysfunction of lumbar region: Secondary | ICD-10-CM | POA: Diagnosis not present

## 2018-08-01 DIAGNOSIS — M5117 Intervertebral disc disorders with radiculopathy, lumbosacral region: Secondary | ICD-10-CM | POA: Diagnosis not present

## 2018-08-29 DIAGNOSIS — M5116 Intervertebral disc disorders with radiculopathy, lumbar region: Secondary | ICD-10-CM | POA: Diagnosis not present

## 2018-08-29 DIAGNOSIS — M9903 Segmental and somatic dysfunction of lumbar region: Secondary | ICD-10-CM | POA: Diagnosis not present

## 2018-08-29 DIAGNOSIS — M5117 Intervertebral disc disorders with radiculopathy, lumbosacral region: Secondary | ICD-10-CM | POA: Diagnosis not present

## 2018-09-28 DIAGNOSIS — M9903 Segmental and somatic dysfunction of lumbar region: Secondary | ICD-10-CM | POA: Diagnosis not present

## 2018-09-28 DIAGNOSIS — M5116 Intervertebral disc disorders with radiculopathy, lumbar region: Secondary | ICD-10-CM | POA: Diagnosis not present

## 2018-09-28 DIAGNOSIS — M5117 Intervertebral disc disorders with radiculopathy, lumbosacral region: Secondary | ICD-10-CM | POA: Diagnosis not present

## 2018-11-02 DIAGNOSIS — M5116 Intervertebral disc disorders with radiculopathy, lumbar region: Secondary | ICD-10-CM | POA: Diagnosis not present

## 2018-11-02 DIAGNOSIS — M9903 Segmental and somatic dysfunction of lumbar region: Secondary | ICD-10-CM | POA: Diagnosis not present

## 2018-11-02 DIAGNOSIS — M5117 Intervertebral disc disorders with radiculopathy, lumbosacral region: Secondary | ICD-10-CM | POA: Diagnosis not present

## 2018-11-25 DIAGNOSIS — E349 Endocrine disorder, unspecified: Secondary | ICD-10-CM | POA: Diagnosis not present

## 2019-07-05 ENCOUNTER — Ambulatory Visit: Payer: 59 | Admitting: Urology

## 2019-07-05 ENCOUNTER — Encounter: Payer: Self-pay | Admitting: Urology

## 2019-07-05 ENCOUNTER — Other Ambulatory Visit: Payer: Self-pay

## 2019-07-05 VITALS — BP 196/114 | HR 65 | Ht 71.0 in | Wt 213.0 lb

## 2019-07-05 DIAGNOSIS — Z3009 Encounter for other general counseling and advice on contraception: Secondary | ICD-10-CM

## 2019-07-05 MED ORDER — DIAZEPAM 5 MG PO TABS: 5.0000 mg | ORAL_TABLET | Freq: Once | ORAL | 0 refills | Status: AC | PRN

## 2019-07-05 NOTE — Progress Notes (Signed)
   07/05/19 11:06 AM   Jacob Stein [birth year] [id number]  Referring provider: Adin Hector, MD [facility] [place],  [place] [address]  CC: Discuss vasectomy  HPI: I saw Jacob Stein in urology clinic in consultation from Dr. Caryl Comes for vasectomy.  He is a healthy 53 year old male with 5 children ranging in age from 60-26.  He is married and he and his wife do not desire any further biologic children.   PMH: Hypertension ED  Surgical History: Open pyloric stenosis repair as infant  Allergies: No Known Allergies  Family History: No family history on file.  Social History:  reports that he has never smoked. He has never used smokeless tobacco. He reports previous alcohol use. No history on file for drug.  ROS: Please see flowsheet from today's date for complete review of systems.  Physical Exam: BP (!) 196/114   Pulse 65   Ht 1' (0.305 m)   Wt 213 lb (96.6 kg)   BMI 1039.97 kg/m    Constitutional:  Alert and oriented, No acute distress. Cardiovascular: No clubbing, cyanosis, or edema. Respiratory: Normal respiratory effort, no increased work of breathing. GI: Abdomen is soft, nontender, nondistended, no abdominal masses GU: Thickened cords but vas deferens palpable bilaterally, testicles 20 cc and descended without masses Lymph: No cervical or inguinal lymphadenopathy. Skin: No rashes, bruises or suspicious lesions. Neurologic: Grossly intact, no focal deficits, moving all 4 extremities. Psychiatric: Normal mood and affect.   Assessment & Plan:   In summary, he is a healthy 53 year old male who desires vasectomy for permanent surgical sterilization.  We discussed the risks and benefits of vasectomy at length.  Vasectomy is intended to be a permanent form of contraception, and does not produce immediate sterility.  Following vasectomy another form of contraception is required until vas occlusion is confirmed by a post-vasectomy semen  analysis obtained 2-3 months after the procedure.  Even after vas occlusion is confirmed, vasectomy is not 100% reliable in preventing pregnancy, and the failure rate is approximately 07/1998.  Repeat vasectomy is required in less than 1% of patients.  He should refrain from ejaculation for 1 week after vasectomy.  Options for fertility after vasectomy include vasectomy reversal, and sperm retrieval with in vitro fertilization or ICSI.  These options are not always successful and may be expensive.  Finally, there are other permanent and non-permanent alternatives to vasectomy available. There is no risk of erectile dysfunction, and the volume of semen will be similar to prior, as the majority of the ejaculate is from the prostate and seminal vesicles.   The procedure takes ~20 minutes.  We recommend patients take 5-10 mg of Valium 30 minutes prior, and he will need a driver post-procedure.  Local anesthetic is injected into the scrotal skin and a small segment of the vas deferens is removed, and the ends occluded. The complication rate is approximately 1-2%, and includes bleeding, infection, and development of chronic scrotal pain.  PLAN: Pending insurance approval, will schedule for vasectomy at his convenience   A total of 40 minutes were spent face-to-face with the patient, greater than 50% was spent in patient education, counseling, and coordination of care regarding risks, benefits, and alternatives to vasectomy.   Billey Co, [facility] [address] [place], [place] [address] [phone]

## 2019-07-05 NOTE — Patient Instructions (Signed)

## 2019-08-09 ENCOUNTER — Encounter: Payer: 59 | Admitting: Urology

## 2022-12-07 ENCOUNTER — Ambulatory Visit: Payer: 59

## 2022-12-07 DIAGNOSIS — D125 Benign neoplasm of sigmoid colon: Secondary | ICD-10-CM

## 2022-12-07 DIAGNOSIS — Z09 Encounter for follow-up examination after completed treatment for conditions other than malignant neoplasm: Secondary | ICD-10-CM | POA: Diagnosis not present

## 2022-12-07 DIAGNOSIS — Z86010 Personal history of colonic polyps: Secondary | ICD-10-CM

## 2022-12-07 DIAGNOSIS — K640 First degree hemorrhoids: Secondary | ICD-10-CM

## 2024-03-20 ENCOUNTER — Other Ambulatory Visit: Payer: Self-pay | Admitting: Internal Medicine

## 2024-03-20 DIAGNOSIS — M7989 Other specified soft tissue disorders: Secondary | ICD-10-CM

## 2024-03-20 DIAGNOSIS — M79604 Pain in right leg: Secondary | ICD-10-CM

## 2024-03-22 ENCOUNTER — Ambulatory Visit
Admission: RE | Admit: 2024-03-22 | Discharge: 2024-03-22 | Disposition: A | Discharge status: Home / Self Care | Source: Ambulatory Visit | Attending: Internal Medicine | Admitting: Internal Medicine

## 2024-03-22 DIAGNOSIS — M7989 Other specified soft tissue disorders: Secondary | ICD-10-CM | POA: Insufficient documentation

## 2024-03-22 DIAGNOSIS — M79604 Pain in right leg: Secondary | ICD-10-CM | POA: Insufficient documentation
